# Patient Record
Sex: Female | Born: 1975 | Race: White | Hispanic: No | Marital: Married
Health system: Southern US, Community
[De-identification: ages and names within clinical notes are randomized; demographics above are authoritative.]

---

## 2011-04-15 ENCOUNTER — Other Ambulatory Visit (HOSPITAL_COMMUNITY)
Admission: RE | Admit: 2011-04-15 | Discharge: 2011-04-15 | Disposition: A | Discharge status: Home / Self Care | Payer: BC Managed Care – PPO | Source: Ambulatory Visit | Attending: Family Medicine | Admitting: Family Medicine

## 2011-04-15 DIAGNOSIS — Z124 Encounter for screening for malignant neoplasm of cervix: Secondary | ICD-10-CM | POA: Insufficient documentation

## 2012-04-14 ENCOUNTER — Other Ambulatory Visit (HOSPITAL_COMMUNITY)
Admission: RE | Admit: 2012-04-14 | Discharge: 2012-04-14 | Disposition: A | Discharge status: Home / Self Care | Payer: BC Managed Care – PPO | Source: Ambulatory Visit | Attending: Family Medicine | Admitting: Family Medicine

## 2012-04-14 DIAGNOSIS — Z124 Encounter for screening for malignant neoplasm of cervix: Secondary | ICD-10-CM | POA: Insufficient documentation

## 2013-06-08 ENCOUNTER — Other Ambulatory Visit (HOSPITAL_COMMUNITY)
Admission: RE | Admit: 2013-06-08 | Discharge: 2013-06-08 | Disposition: A | Discharge status: Home / Self Care | Payer: Managed Care, Other (non HMO) | Source: Ambulatory Visit | Attending: Family Medicine | Admitting: Family Medicine

## 2013-06-08 ENCOUNTER — Other Ambulatory Visit: Payer: Self-pay | Admitting: Physician Assistant

## 2013-06-08 DIAGNOSIS — Z124 Encounter for screening for malignant neoplasm of cervix: Secondary | ICD-10-CM | POA: Insufficient documentation

## 2015-09-06 ENCOUNTER — Other Ambulatory Visit: Payer: Self-pay | Admitting: Physician Assistant

## 2015-09-06 ENCOUNTER — Other Ambulatory Visit (HOSPITAL_COMMUNITY)
Admission: RE | Admit: 2015-09-06 | Discharge: 2015-09-06 | Disposition: A | Discharge status: Home / Self Care | Payer: BLUE CROSS/BLUE SHIELD | Source: Ambulatory Visit | Attending: Family Medicine | Admitting: Family Medicine

## 2015-09-06 DIAGNOSIS — Z124 Encounter for screening for malignant neoplasm of cervix: Secondary | ICD-10-CM | POA: Insufficient documentation

## 2015-09-10 LAB — CYTOLOGY - PAP

## 2016-01-08 ENCOUNTER — Other Ambulatory Visit: Payer: Self-pay

## 2016-01-08 DIAGNOSIS — Z1231 Encounter for screening mammogram for malignant neoplasm of breast: Secondary | ICD-10-CM

## 2016-01-09 ENCOUNTER — Ambulatory Visit
Admission: RE | Admit: 2016-01-09 | Discharge: 2016-01-09 | Disposition: A | Discharge status: Home / Self Care | Payer: BLUE CROSS/BLUE SHIELD | Source: Ambulatory Visit

## 2016-01-09 DIAGNOSIS — Z1231 Encounter for screening mammogram for malignant neoplasm of breast: Secondary | ICD-10-CM

## 2017-01-28 ENCOUNTER — Other Ambulatory Visit: Payer: Self-pay | Admitting: Physician Assistant

## 2017-01-28 DIAGNOSIS — Z1231 Encounter for screening mammogram for malignant neoplasm of breast: Secondary | ICD-10-CM

## 2017-02-13 ENCOUNTER — Ambulatory Visit: Payer: BLUE CROSS/BLUE SHIELD

## 2017-02-25 ENCOUNTER — Ambulatory Visit: Payer: BLUE CROSS/BLUE SHIELD

## 2017-03-26 ENCOUNTER — Ambulatory Visit
Admission: RE | Admit: 2017-03-26 | Discharge: 2017-03-26 | Disposition: A | Discharge status: Home / Self Care | Payer: BLUE CROSS/BLUE SHIELD | Source: Ambulatory Visit | Attending: Physician Assistant | Admitting: Physician Assistant

## 2017-03-26 DIAGNOSIS — Z1231 Encounter for screening mammogram for malignant neoplasm of breast: Secondary | ICD-10-CM

## 2018-07-21 DIAGNOSIS — H52223 Regular astigmatism, bilateral: Secondary | ICD-10-CM | POA: Diagnosis not present

## 2018-07-21 DIAGNOSIS — H5213 Myopia, bilateral: Secondary | ICD-10-CM | POA: Diagnosis not present

## 2018-07-21 DIAGNOSIS — H524 Presbyopia: Secondary | ICD-10-CM | POA: Diagnosis not present

## 2018-08-25 ENCOUNTER — Other Ambulatory Visit: Payer: Self-pay | Admitting: Physician Assistant

## 2018-08-25 DIAGNOSIS — Z1231 Encounter for screening mammogram for malignant neoplasm of breast: Secondary | ICD-10-CM

## 2018-10-14 ENCOUNTER — Other Ambulatory Visit: Payer: Self-pay | Admitting: Physician Assistant

## 2018-10-14 ENCOUNTER — Other Ambulatory Visit (HOSPITAL_COMMUNITY)
Admission: RE | Admit: 2018-10-14 | Discharge: 2018-10-14 | Disposition: A | Discharge status: Home / Self Care | Payer: 59 | Source: Ambulatory Visit | Attending: Family Medicine | Admitting: Family Medicine

## 2018-10-14 DIAGNOSIS — N6310 Unspecified lump in the right breast, unspecified quadrant: Secondary | ICD-10-CM

## 2018-10-14 DIAGNOSIS — Z01419 Encounter for gynecological examination (general) (routine) without abnormal findings: Secondary | ICD-10-CM | POA: Diagnosis not present

## 2018-10-14 DIAGNOSIS — Z0000 Encounter for general adult medical examination without abnormal findings: Secondary | ICD-10-CM | POA: Diagnosis not present

## 2018-10-14 DIAGNOSIS — Z13220 Encounter for screening for lipoid disorders: Secondary | ICD-10-CM | POA: Diagnosis not present

## 2018-10-14 DIAGNOSIS — Z124 Encounter for screening for malignant neoplasm of cervix: Secondary | ICD-10-CM | POA: Diagnosis not present

## 2018-10-15 LAB — CYTOLOGY - PAP: DIAGNOSIS: NEGATIVE

## 2018-10-18 ENCOUNTER — Other Ambulatory Visit: Payer: Self-pay | Admitting: Physician Assistant

## 2018-10-18 ENCOUNTER — Ambulatory Visit
Admission: RE | Admit: 2018-10-18 | Discharge: 2018-10-18 | Disposition: A | Discharge status: Home / Self Care | Payer: BLUE CROSS/BLUE SHIELD | Source: Ambulatory Visit | Attending: Physician Assistant | Admitting: Physician Assistant

## 2018-10-18 ENCOUNTER — Ambulatory Visit
Admission: RE | Admit: 2018-10-18 | Discharge: 2018-10-18 | Disposition: A | Discharge status: Home / Self Care | Payer: 59 | Source: Ambulatory Visit | Attending: Physician Assistant | Admitting: Physician Assistant

## 2018-10-18 DIAGNOSIS — N6310 Unspecified lump in the right breast, unspecified quadrant: Secondary | ICD-10-CM

## 2018-10-18 DIAGNOSIS — R922 Inconclusive mammogram: Secondary | ICD-10-CM | POA: Diagnosis not present

## 2018-10-18 DIAGNOSIS — N6314 Unspecified lump in the right breast, lower inner quadrant: Secondary | ICD-10-CM | POA: Diagnosis not present

## 2018-10-22 ENCOUNTER — Ambulatory Visit
Admission: RE | Admit: 2018-10-22 | Discharge: 2018-10-22 | Disposition: A | Discharge status: Home / Self Care | Payer: 59 | Source: Ambulatory Visit | Attending: Physician Assistant | Admitting: Physician Assistant

## 2018-10-22 ENCOUNTER — Other Ambulatory Visit: Payer: Self-pay | Admitting: Physician Assistant

## 2018-10-22 DIAGNOSIS — N6314 Unspecified lump in the right breast, lower inner quadrant: Secondary | ICD-10-CM | POA: Diagnosis not present

## 2018-10-22 DIAGNOSIS — N6310 Unspecified lump in the right breast, unspecified quadrant: Secondary | ICD-10-CM

## 2018-10-22 DIAGNOSIS — D241 Benign neoplasm of right breast: Secondary | ICD-10-CM | POA: Diagnosis not present

## 2019-03-24 MED FILL — ACYCLOVIR 400 MG TABLET: 400 | 90 days supply | Qty: 180 | Fill #0

## 2019-04-14 ENCOUNTER — Encounter (HOSPITAL_COMMUNITY): Payer: Self-pay | Admitting: Emergency Medicine

## 2019-04-14 ENCOUNTER — Emergency Department (HOSPITAL_COMMUNITY): Payer: 59

## 2019-04-14 ENCOUNTER — Other Ambulatory Visit: Payer: Self-pay

## 2019-04-14 ENCOUNTER — Emergency Department (HOSPITAL_COMMUNITY)
Admission: EM | Admit: 2019-04-14 | Discharge: 2019-04-14 | Disposition: A | Discharge status: Home / Self Care | Payer: 59 | Attending: Emergency Medicine | Admitting: Emergency Medicine

## 2019-04-14 DIAGNOSIS — Z79899 Other long term (current) drug therapy: Secondary | ICD-10-CM | POA: Insufficient documentation

## 2019-04-14 DIAGNOSIS — R079 Chest pain, unspecified: Secondary | ICD-10-CM | POA: Diagnosis not present

## 2019-04-14 DIAGNOSIS — J029 Acute pharyngitis, unspecified: Secondary | ICD-10-CM | POA: Diagnosis not present

## 2019-04-14 DIAGNOSIS — R0789 Other chest pain: Secondary | ICD-10-CM | POA: Insufficient documentation

## 2019-04-14 DIAGNOSIS — R0602 Shortness of breath: Secondary | ICD-10-CM | POA: Diagnosis not present

## 2019-04-14 DIAGNOSIS — Z20828 Contact with and (suspected) exposure to other viral communicable diseases: Secondary | ICD-10-CM | POA: Diagnosis not present

## 2019-04-14 LAB — CBC WITH DIFFERENTIAL/PLATELET
Abs Immature Granulocytes: 0.03 10*3/uL (ref 0.00–0.07)
Basophils Absolute: 0.1 10*3/uL (ref 0.0–0.1)
Basophils Relative: 1 %
Eosinophils Absolute: 0.3 10*3/uL (ref 0.0–0.5)
Eosinophils Relative: 4 %
HCT: 36.8 % (ref 36.0–46.0)
Hemoglobin: 11.9 g/dL — ABNORMAL LOW (ref 12.0–15.0)
Immature Granulocytes: 0 %
Lymphocytes Relative: 33 %
Lymphs Abs: 3.1 10*3/uL (ref 0.7–4.0)
MCH: 28.9 pg (ref 26.0–34.0)
MCHC: 32.3 g/dL (ref 30.0–36.0)
MCV: 89.3 fL (ref 80.0–100.0)
Monocytes Absolute: 1 10*3/uL (ref 0.1–1.0)
Monocytes Relative: 11 %
Neutro Abs: 4.9 10*3/uL (ref 1.7–7.7)
Neutrophils Relative %: 51 %
Platelets: 370 10*3/uL (ref 150–400)
RBC: 4.12 MIL/uL (ref 3.87–5.11)
RDW: 13.7 % (ref 11.5–15.5)
WBC: 9.4 10*3/uL (ref 4.0–10.5)
nRBC: 0 % (ref 0.0–0.2)

## 2019-04-14 LAB — BASIC METABOLIC PANEL
Anion gap: 14 (ref 5–15)
BUN: 8 mg/dL (ref 6–20)
CO2: 21 mmol/L — ABNORMAL LOW (ref 22–32)
Calcium: 8.9 mg/dL (ref 8.9–10.3)
Chloride: 103 mmol/L (ref 98–111)
Creatinine, Ser: 0.95 mg/dL (ref 0.44–1.00)
GFR calc Af Amer: >60 mL/min (ref >60)
GFR calc non Af Amer: >60 mL/min (ref >60)
Glucose, Bld: 143 mg/dL — ABNORMAL HIGH (ref 70–99)
Potassium: 3.4 mmol/L — ABNORMAL LOW (ref 3.5–5.1)
Sodium: 138 mmol/L (ref 135–145)

## 2019-04-14 LAB — I-STAT BETA HCG BLOOD, ED (MC, WL, AP ONLY): I-stat hCG, quantitative: <5 m[IU]/mL (ref <5)

## 2019-04-14 LAB — D-DIMER, QUANTITATIVE: D-Dimer, Quant: 0.33 ug/mL-FEU (ref 0.00–0.50)

## 2019-04-14 LAB — TROPONIN I: Troponin I: <0.03 ng/mL (ref <0.03)

## 2019-04-14 MED ORDER — NITROGLYCERIN 0.4 MG SL SUBL
0.4000 mg | SUBLINGUAL_TABLET | SUBLINGUAL | Status: DC | PRN
  Administered 2019-04-14: 0.4 mg via SUBLINGUAL
  Filled 2019-04-14: qty 1

## 2019-04-14 MED ORDER — POTASSIUM CHLORIDE CRYS ER 20 MEQ PO TBCR
20.0000 meq | EXTENDED_RELEASE_TABLET | Freq: Once | ORAL | Status: AC
  Administered 2019-04-14: 20 meq via ORAL
  Filled 2019-04-14: qty 1

## 2019-04-14 NOTE — ED Provider Notes (Signed)
[facility] EMERGENCY DEPARTMENT Provider Note   CSN: [id number] Arrival date & time: 04/14/19  0347    History   Chief Complaint Chief Complaint  Patient presents with   Chest Pain    HPI Shannon Russo is a 43 y.o. female with no significant past medical history who presents for evaluation of chest pain and SOB.  Patient reports that 3 days ago, she started noticing some heaviness/tightness across her anterior chest.  Patient states that it was very mild in nature.  No associated diaphoresis, nausea/vomiting. She states that the chest heaviness/tightness has been constant since Monday and has never gone away. Patient states that yesterday, she noticed that while walking up the stairs, she became very winded and had shortness of breath.  Patient states that her chest tightness/heaviness was slightly worsened by going up the stairs but wasn't worsened with all exertional activity. Patient states that the chest pain is not worse with deep inspiration.   Patient states that this morning at 3 AM, she woke up and felt like she was having trouble breathing.    Patient states she feels like "she is having trouble taking a deep breath." She states she laid on her back for a few minutes to see if the symptoms would resolve. Patient states she continued to feel like she was SOB prompting ED visit. Patient states that the chest tightness did not get worse.  Patient states she has not had any cough, congestion, fever.  She does report she had a sore throat about 5 days ago.  She works as an Therapist, sports at the [employer] and reports that she had been tested for COVID-19 but has not had results of the test. She denies any OCP use, recent immobilization, prior history of DVT/PE, recent surgery, leg swelling, or long travel.  Patient denies any abdominal pain, nausea/vomiting, numbness/weakness of arms or legs.  Patient states she is not a current smoker.  She denies any personal cardiac history and  denies any family history of MIs before age 23.  She denies any history of hypertension, diabetes.     The history is provided by the patient.    History reviewed. No pertinent past medical history.  There are no active problems to display for this patient.   History reviewed. No pertinent surgical history.   OB History   No obstetric history on file.      Home Medications    Prior to Admission medications   Medication Sig Start Date End Date Taking? Authorizing Provider  acyclovir (ZOVIRAX) 400 MG tablet Take 400 mg by mouth 2 (two) times a day. 03/24/19  Yes [provider]  Homeopathic Products (ZICAM COLD REMEDY PO) Take 1 tablet by mouth daily.   Yes [provider]  vitamin C (ASCORBIC ACID) 500 MG tablet Take 500 mg by mouth daily.   Yes [provider]    Family History Family History  Problem Relation Age of Onset   Breast cancer Maternal Aunt 70    Social History Social History   Tobacco Use   Smoking status: Not on file   Smokeless tobacco: Never Used  Substance Use Topics   Alcohol use: Never    Frequency: Never   Drug use: Never     Allergies   Patient has no known allergies.   Review of Systems Review of Systems  Constitutional: Negative for fever.  HENT: Positive for sore throat.   Respiratory: Positive for shortness of breath. Negative for  cough.   Cardiovascular: Positive for chest pain. Negative for leg swelling.  Gastrointestinal: Negative for abdominal pain, nausea and vomiting.  Genitourinary: Negative for dysuria and hematuria.  Neurological: Negative for headaches.  All other systems reviewed and are negative.    Physical Exam Updated Vital Signs BP 104/67    Pulse 63    Temp 98.4 F (36.9 C) (Oral)    Resp 14    Ht 5\' 5"  (1.651 m)    Wt 59.9 kg    LMP 04/07/2019 (Exact Date)    SpO2 100%    BMI 21.97 kg/m   Physical Exam Vitals signs and nursing note reviewed.  Constitutional:       Appearance: Normal appearance. She is well-developed.     Comments: Appears anxious   HENT:     Head: Normocephalic and atraumatic.  Eyes:     General: Lids are normal.     Conjunctiva/sclera: Conjunctivae normal.     Pupils: Pupils are equal, round, and reactive to light.  Neck:     Musculoskeletal: Full passive range of motion without pain.  Cardiovascular:     Rate and Rhythm: Normal rate and regular rhythm.     Pulses: Normal pulses.          Radial pulses are 2+ on the right side and 2+ on the left side.       Dorsalis pedis pulses are 2+ on the right side and 2+ on the left side.     Heart sounds: Normal heart sounds. No murmur. No friction rub. No gallop.   Pulmonary:     Effort: Pulmonary effort is normal.     Breath sounds: Normal breath sounds.     Comments: Lungs clear to auscultation bilaterally.  Symmetric chest rise.  No wheezing, rales, rhonchi.  Able to speak in full sentences without any difficulty. Abdominal:     Palpations: Abdomen is soft. Abdomen is not rigid.     Tenderness: There is no abdominal tenderness. There is no guarding.  Musculoskeletal: Normal range of motion.     Comments: Bilateral lower extremities are symmetric in appearance.  Skin:    General: Skin is warm and dry.     Capillary Refill: Capillary refill takes less than 2 seconds.  Neurological:     Mental Status: She is alert and oriented to person, place, and time.  Psychiatric:        Speech: Speech normal.      ED Treatments / Results  Labs (all labs ordered are listed, but only abnormal results are displayed) Labs Reviewed  BASIC METABOLIC PANEL - Abnormal; Notable for the following components:      Result Value   Potassium 3.4 (*)    CO2 21 (*)    Glucose, Bld 143 (*)    All other components within normal limits  CBC WITH DIFFERENTIAL/PLATELET - Abnormal; Notable for the following components:   Hemoglobin 11.9 (*)    All other components within normal limits  D-DIMER,  QUANTITATIVE (NOT AT [facility])  TROPONIN I  I-STAT BETA HCG BLOOD, ED ([facility], [facility], [facility] ONLY)    EKG EKG Interpretation  Date/Time:  Thursday Apr 14 2019 04:12:21 EDT Ventricular Rate:  78 PR Interval:    QRS Duration: 107 QT Interval:  429 QTC Calculation: 489 R Axis:   58 Text Interpretation:  Sinus rhythm Borderline prolonged QT interval No old tracing to compare Confirmed by Merrily Pew [phone]) on 04/14/2019 6:32:37 AM   Radiology Dg Chest Portable  1 View  Result Date: 04/14/2019 CLINICAL DATA:  Chest pain EXAM: PORTABLE CHEST 1 VIEW COMPARISON:  None. FINDINGS: The heart size and mediastinal contours are within normal limits. Both lungs are clear. The visualized skeletal structures are unremarkable. IMPRESSION: Negative chest. Electronically Signed   By: Monte Fantasia M.D.   On: 04/14/2019 04:45    Procedures Procedures (including critical care time)  Medications Ordered in ED Medications  nitroGLYCERIN (NITROSTAT) SL tablet 0.4 mg (0.4 mg Sublingual Given 04/14/19 0506)  potassium chloride SA (K-DUR) CR tablet 20 mEq (has no administration in time range)     Initial Impression / Assessment and Plan / ED Course  I have reviewed the triage vital signs and the nursing notes.  Pertinent labs & imaging results that were available during my care of the patient were reviewed by me and considered in my medical decision making (see chart for details).        43 year old female who presents for evaluation of chest pain shortness of breath.  Reports chest pain x3 days.  Reports noticing yesterday while walking up the stairs, she would become more short of breath.  Woke up at 3 AM this morning with worsening chest heaviness/tightness and shortness of breath.  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  Has not gotten the results back. Patient is afebrile, non-toxic appearing, sitting comfortably on examination table. Vital signs reviewed and stable.  Consider anxiety  versus musculoskeletal pain versus ACS etiology. History/physical exam is not concerning for aortic dissection, unstable angina.  Also consider COVID-19, though low suspicion.  Doubt infectious etiology due to lack of infectious symptoms.  Also consider PE given dyspnea with exertion.  Patient is low risk.  Plan for labs, chest x-ray, EKG.  Given patient's risk factors and presentation she has a HEART score of 1. She has no personal cardiac history and no family history of MI.   I-stat beta is negative. Troponin is negative. BMP shows potassium of 3.4, Bicarb of 21. Dimer negative. CBC shows no leukocytosis. Hgb is 11.9. CXR negative for any infectious etiology.   Discussed results with patient. Patient reports improvement in pain after being here in the ED.  I discussed at length with patient regarding her symptoms.  She states she came into the emergency department today mainly because the shortness of breath had worsened.  She does report that her chest pain has been constant since Monday with no episodes of pain-free time.  Given constant chest pain for 3 days, reassured by EKG and negative troponin.  I did discuss with patient regarding obtaining a second troponin given worsening of her symptoms at 3 AM.  Patient states that the chest pain had not worsened but only no shortness of breath.  Patient wishes to decline delta troponin and wants to go home.  Discussed risk versus benefits of declining delta troponin.  Patient expresses understanding.  She wishes not to proceed with delta troponin at this time.  Given low risk status and reassuring work-up in the ED after 3 days of constant chest pain, will plan to discharge home with outpatient follow-up. At this time, patient exhibits no emergent life-threatening condition that require further evaluation in ED or admission. .Strict return precautions discussed. Patient expresses understanding and agreement to plan.    Portions of this note were generated  with [employer]. Dictation errors may occur despite best attempts at proofreading.  Final Clinical Impressions(s) / ED Diagnoses   Final diagnoses:  Atypical chest pain  ED Discharge Orders    None       Desma Mcgregor 04/15/19 0012    Mesner, Corene Cornea, MD 04/15/19 [phone]

## 2019-04-14 NOTE — ED Notes (Signed)
Patient works at the [employer] , was tested for [employer] not back yet c/o sob

## 2019-04-14 NOTE — Discharge Instructions (Signed)
Follow up with your primary care doctor.   Return to the Emergency Department immediately if you experiencing worsening chest pain, difficulty breathing, nausea/vomiting, get very sweaty, headache or any other worsening or concerning symptoms.   

## 2019-04-14 NOTE — ED Triage Notes (Addendum)
Patient with chest pain that started on Monday.  Today she started to become winded doing everyday things, like going up stairs.  No nausea or vomiting.  She describes that pain as a pounding in her chest and a heaviness, burning sensation.  Tested for covid at [facility] drive up, going thru [employer], patient is a Gaffer at [employer].

## 2019-09-08 ENCOUNTER — Other Ambulatory Visit: Payer: Self-pay | Admitting: Physician Assistant

## 2019-09-08 DIAGNOSIS — Z1231 Encounter for screening mammogram for malignant neoplasm of breast: Secondary | ICD-10-CM

## 2019-10-20 DIAGNOSIS — Z0000 Encounter for general adult medical examination without abnormal findings: Secondary | ICD-10-CM | POA: Diagnosis not present

## 2019-10-20 DIAGNOSIS — Z13220 Encounter for screening for lipoid disorders: Secondary | ICD-10-CM | POA: Diagnosis not present

## 2019-10-20 DIAGNOSIS — Z131 Encounter for screening for diabetes mellitus: Secondary | ICD-10-CM | POA: Diagnosis not present

## 2019-10-26 ENCOUNTER — Other Ambulatory Visit: Payer: Self-pay

## 2019-10-26 ENCOUNTER — Ambulatory Visit
Admission: RE | Admit: 2019-10-26 | Discharge: 2019-10-26 | Disposition: A | Discharge status: Home / Self Care | Payer: 59 | Source: Ambulatory Visit | Attending: Physician Assistant | Admitting: Physician Assistant

## 2019-10-26 DIAGNOSIS — Z1231 Encounter for screening mammogram for malignant neoplasm of breast: Secondary | ICD-10-CM

## 2019-11-01 DIAGNOSIS — H5213 Myopia, bilateral: Secondary | ICD-10-CM | POA: Diagnosis not present

## 2019-11-01 DIAGNOSIS — H524 Presbyopia: Secondary | ICD-10-CM | POA: Diagnosis not present

## 2020-09-26 ENCOUNTER — Inpatient Hospital Stay: Payer: Self-pay | Attending: Physician Assistant

## 2020-09-26 ENCOUNTER — Other Ambulatory Visit: Payer: Self-pay

## 2020-09-26 DIAGNOSIS — Z23 Encounter for immunization: Secondary | ICD-10-CM | POA: Insufficient documentation

## 2020-09-26 NOTE — Progress Notes (Signed)
   Covid-19 Vaccination Clinic  Name:  Shannon Russo    MRN: [id number] DOB: January 30, 1976  09/26/2020  [name] was observed post Covid-19 immunization for 15 minutes without incident. She was provided with Vaccine Information Sheet and instruction to access the V-Safe system.   [name] was instructed to call 911 with any severe reactions post vaccine: Marland Kitchen Difficulty breathing  . Swelling of face and throat  . A fast heartbeat  . A bad rash all over body  . Dizziness and weakness

## 2020-10-08 ENCOUNTER — Other Ambulatory Visit: Payer: Self-pay | Admitting: Physician Assistant

## 2020-10-08 DIAGNOSIS — Z0000 Encounter for general adult medical examination without abnormal findings: Secondary | ICD-10-CM

## 2020-10-24 DIAGNOSIS — Z131 Encounter for screening for diabetes mellitus: Secondary | ICD-10-CM | POA: Diagnosis not present

## 2020-10-24 DIAGNOSIS — Z0000 Encounter for general adult medical examination without abnormal findings: Secondary | ICD-10-CM | POA: Diagnosis not present

## 2020-10-24 DIAGNOSIS — Z13220 Encounter for screening for lipoid disorders: Secondary | ICD-10-CM | POA: Diagnosis not present

## 2020-11-15 ENCOUNTER — Other Ambulatory Visit: Payer: Self-pay

## 2020-11-15 ENCOUNTER — Ambulatory Visit
Admission: RE | Admit: 2020-11-15 | Discharge: 2020-11-15 | Disposition: A | Discharge status: Home / Self Care | Payer: BC Managed Care – PPO | Source: Ambulatory Visit | Attending: Physician Assistant | Admitting: Physician Assistant

## 2020-11-15 DIAGNOSIS — Z1231 Encounter for screening mammogram for malignant neoplasm of breast: Secondary | ICD-10-CM | POA: Diagnosis not present

## 2020-11-15 DIAGNOSIS — Z0000 Encounter for general adult medical examination without abnormal findings: Secondary | ICD-10-CM

## 2020-11-20 DIAGNOSIS — D225 Melanocytic nevi of trunk: Secondary | ICD-10-CM | POA: Diagnosis not present

## 2020-11-20 DIAGNOSIS — L814 Other melanin hyperpigmentation: Secondary | ICD-10-CM | POA: Diagnosis not present

## 2020-11-20 DIAGNOSIS — L821 Other seborrheic keratosis: Secondary | ICD-10-CM | POA: Diagnosis not present

## 2020-11-20 DIAGNOSIS — D485 Neoplasm of uncertain behavior of skin: Secondary | ICD-10-CM | POA: Diagnosis not present

## 2021-11-04 DIAGNOSIS — Z124 Encounter for screening for malignant neoplasm of cervix: Secondary | ICD-10-CM | POA: Diagnosis not present

## 2021-11-04 DIAGNOSIS — Z131 Encounter for screening for diabetes mellitus: Secondary | ICD-10-CM | POA: Diagnosis not present

## 2021-11-04 DIAGNOSIS — Z0000 Encounter for general adult medical examination without abnormal findings: Secondary | ICD-10-CM | POA: Diagnosis not present

## 2021-11-04 DIAGNOSIS — Z13220 Encounter for screening for lipoid disorders: Secondary | ICD-10-CM | POA: Diagnosis not present

## 2021-11-22 ENCOUNTER — Other Ambulatory Visit: Payer: Self-pay | Admitting: Physician Assistant

## 2021-11-22 DIAGNOSIS — Z1231 Encounter for screening mammogram for malignant neoplasm of breast: Secondary | ICD-10-CM

## 2021-12-19 DIAGNOSIS — Z1231 Encounter for screening mammogram for malignant neoplasm of breast: Secondary | ICD-10-CM

## 2022-01-02 ENCOUNTER — Ambulatory Visit
Admission: RE | Admit: 2022-01-02 | Discharge: 2022-01-02 | Disposition: A | Discharge status: Home / Self Care | Payer: BC Managed Care – PPO | Source: Ambulatory Visit | Attending: Physician Assistant | Admitting: Physician Assistant

## 2022-01-02 DIAGNOSIS — Z1231 Encounter for screening mammogram for malignant neoplasm of breast: Secondary | ICD-10-CM | POA: Diagnosis not present

## 2022-07-02 DIAGNOSIS — Z1211 Encounter for screening for malignant neoplasm of colon: Secondary | ICD-10-CM | POA: Diagnosis not present

## 2022-07-02 DIAGNOSIS — K644 Residual hemorrhoidal skin tags: Secondary | ICD-10-CM | POA: Diagnosis not present

## 2022-07-02 DIAGNOSIS — K635 Polyp of colon: Secondary | ICD-10-CM | POA: Diagnosis not present

## 2022-07-02 DIAGNOSIS — Z8371 Family history of colonic polyps: Secondary | ICD-10-CM | POA: Diagnosis not present

## 2022-07-02 DIAGNOSIS — K648 Other hemorrhoids: Secondary | ICD-10-CM | POA: Diagnosis not present

## 2022-11-10 DIAGNOSIS — Z0000 Encounter for general adult medical examination without abnormal findings: Secondary | ICD-10-CM | POA: Diagnosis not present

## 2022-11-10 DIAGNOSIS — B009 Herpesviral infection, unspecified: Secondary | ICD-10-CM | POA: Diagnosis not present

## 2023-02-26 ENCOUNTER — Other Ambulatory Visit (HOSPITAL_BASED_OUTPATIENT_CLINIC_OR_DEPARTMENT_OTHER): Payer: Self-pay | Admitting: Physician Assistant

## 2023-02-26 DIAGNOSIS — Z1231 Encounter for screening mammogram for malignant neoplasm of breast: Secondary | ICD-10-CM

## 2023-03-02 ENCOUNTER — Ambulatory Visit (HOSPITAL_BASED_OUTPATIENT_CLINIC_OR_DEPARTMENT_OTHER)
Admission: RE | Admit: 2023-03-02 | Discharge: 2023-03-02 | Disposition: A | Discharge status: Home / Self Care | Payer: BC Managed Care – PPO | Source: Ambulatory Visit | Attending: Physician Assistant | Admitting: Physician Assistant

## 2023-03-02 DIAGNOSIS — Z1231 Encounter for screening mammogram for malignant neoplasm of breast: Secondary | ICD-10-CM | POA: Diagnosis not present

## 2023-09-10 IMAGING — MG MM DIGITAL SCREENING BILAT W/ TOMO AND CAD
6 of 10 series · 6 of 30 positions shown · non-contrast
Comparison: Previous exam(s).

CLINICAL DATA: Screening.

EXAM:
DIGITAL SCREENING BILATERAL MAMMOGRAM WITH TOMOSYNTHESIS AND CAD
TECHNIQUE: Bilateral screening digital craniocaudal and mediolateral oblique
mammograms were obtained. Bilateral screening digital breast
tomosynthesis was performed. The images were evaluated with
computer-aided detection.

[L MLO synth-2D]
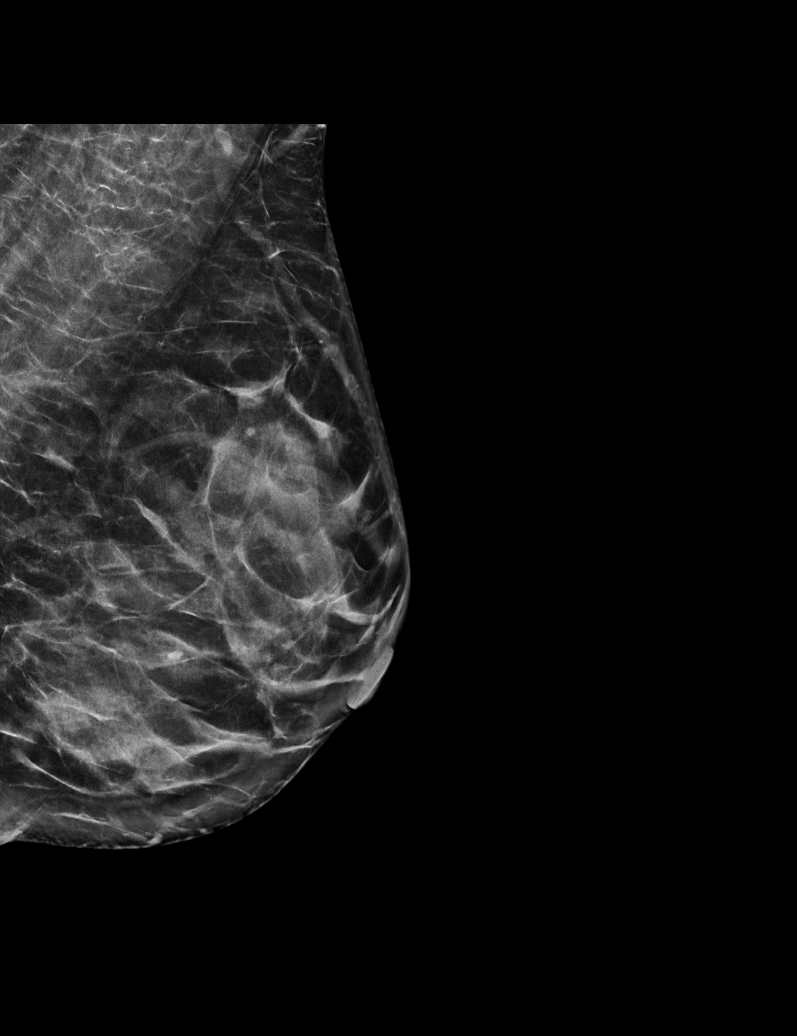

[R CC synth-2D]
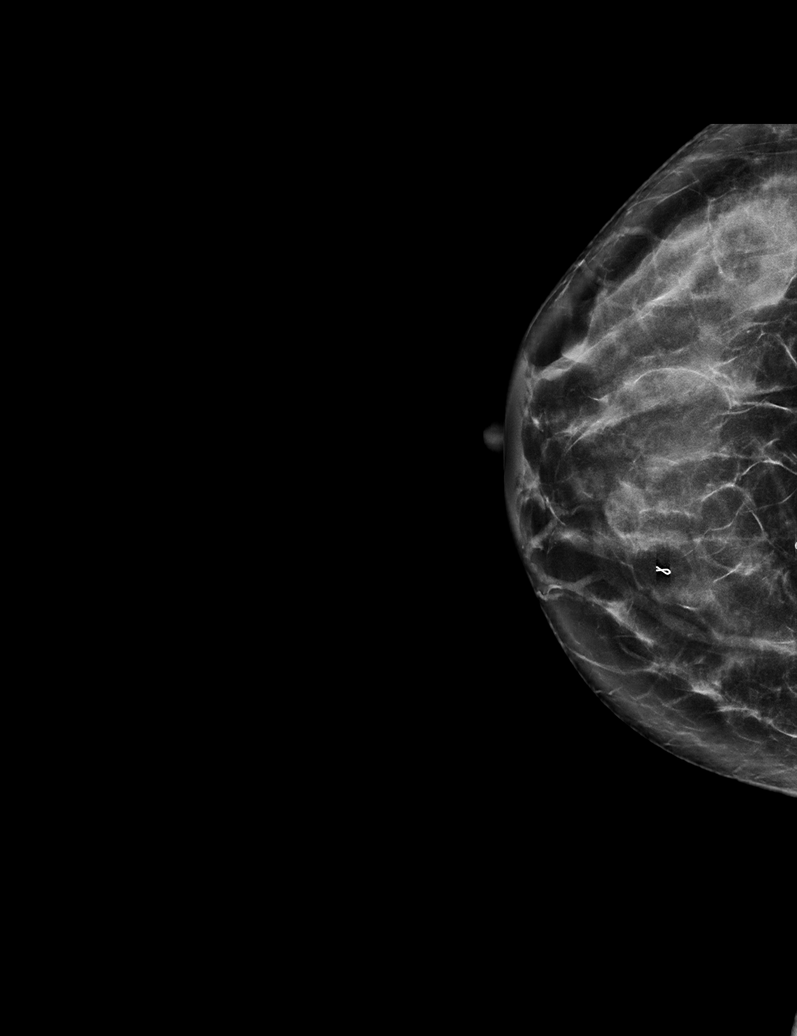

[L CC synth-2D]
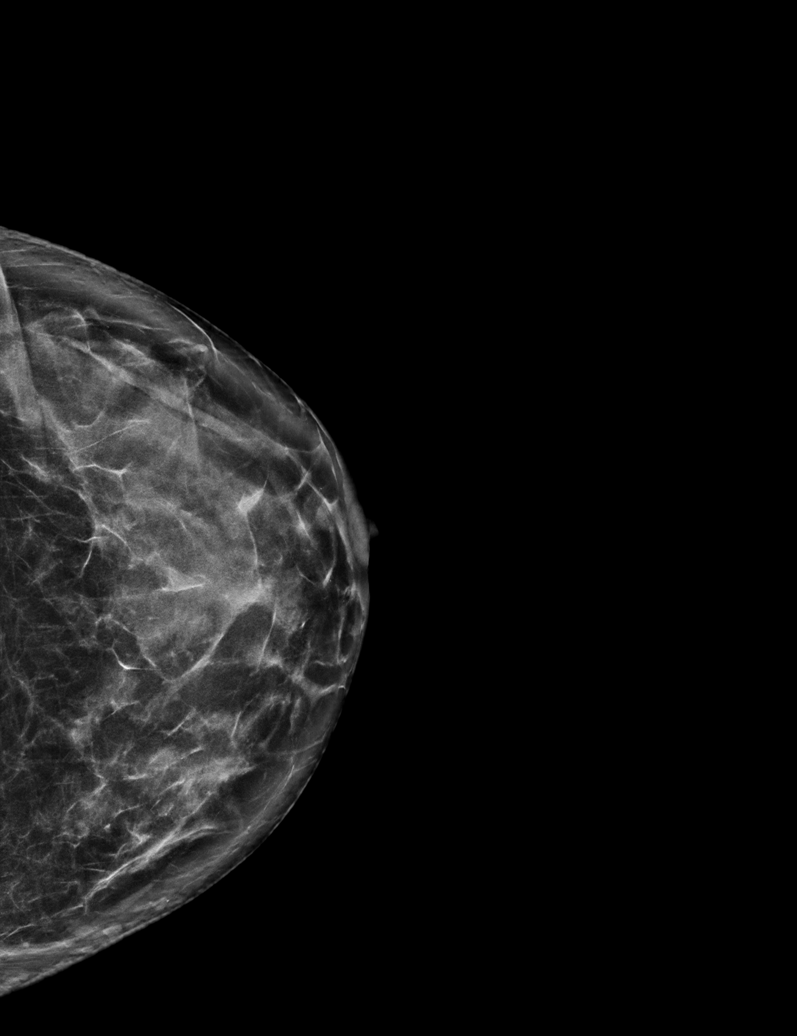

[R MLO synth-2D]
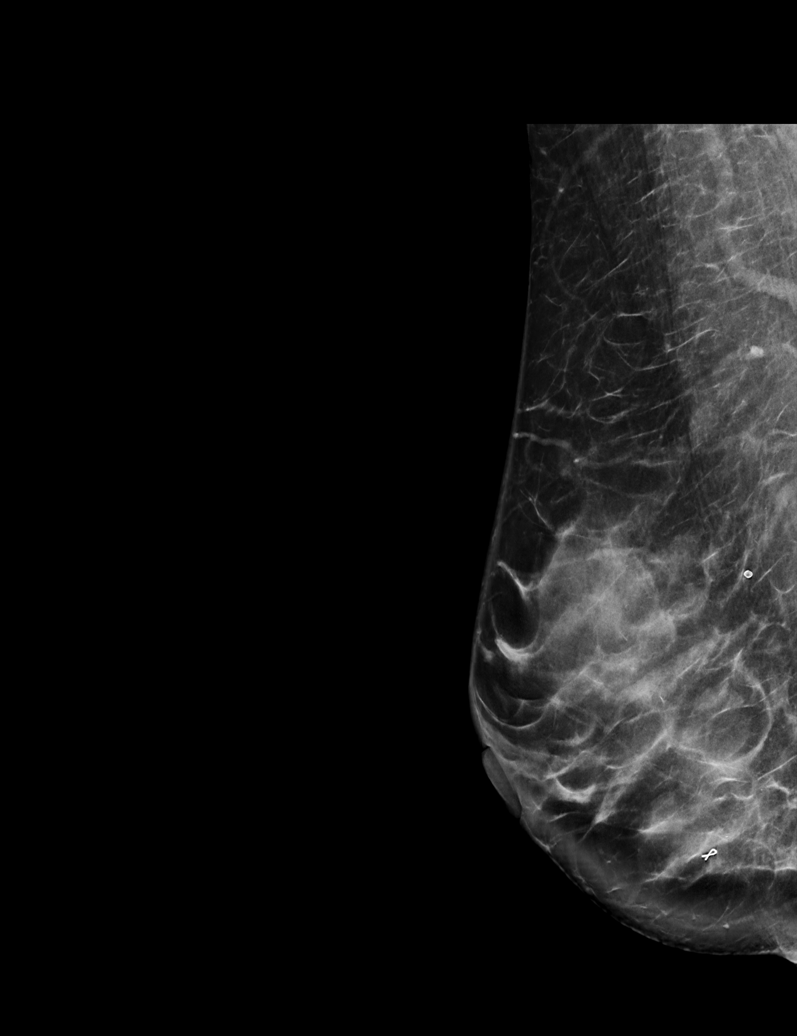

[L XCCL synth-2D]
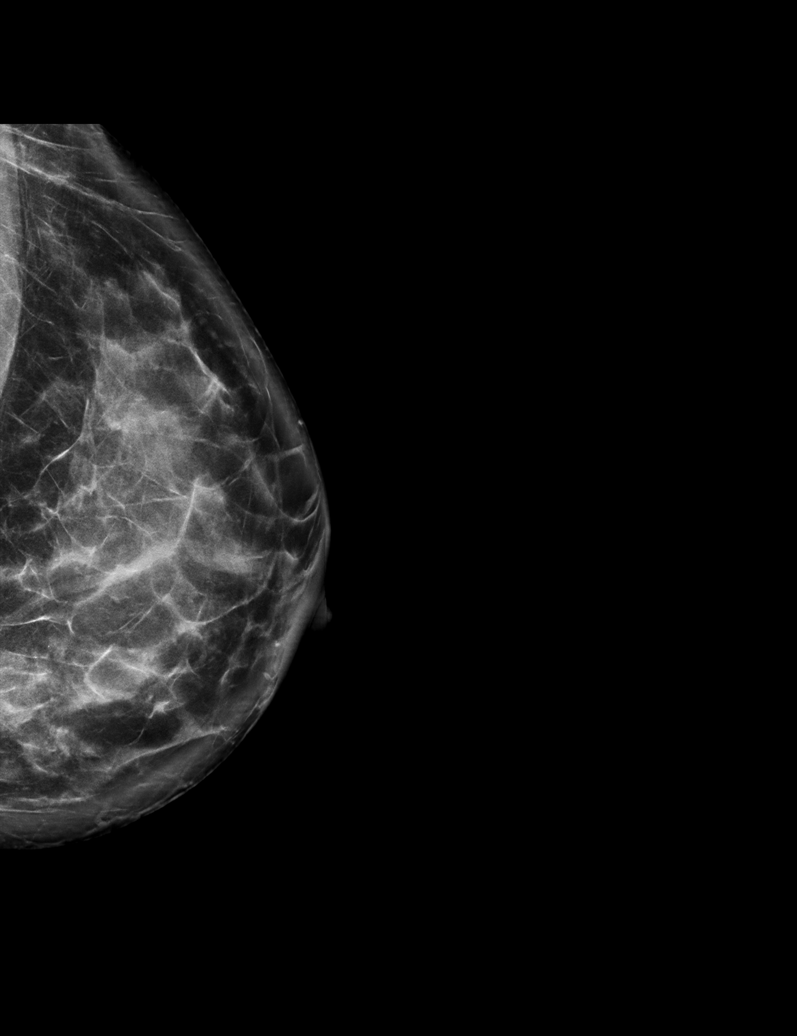

[R MLO tomo · tomo slice 39/77.0]
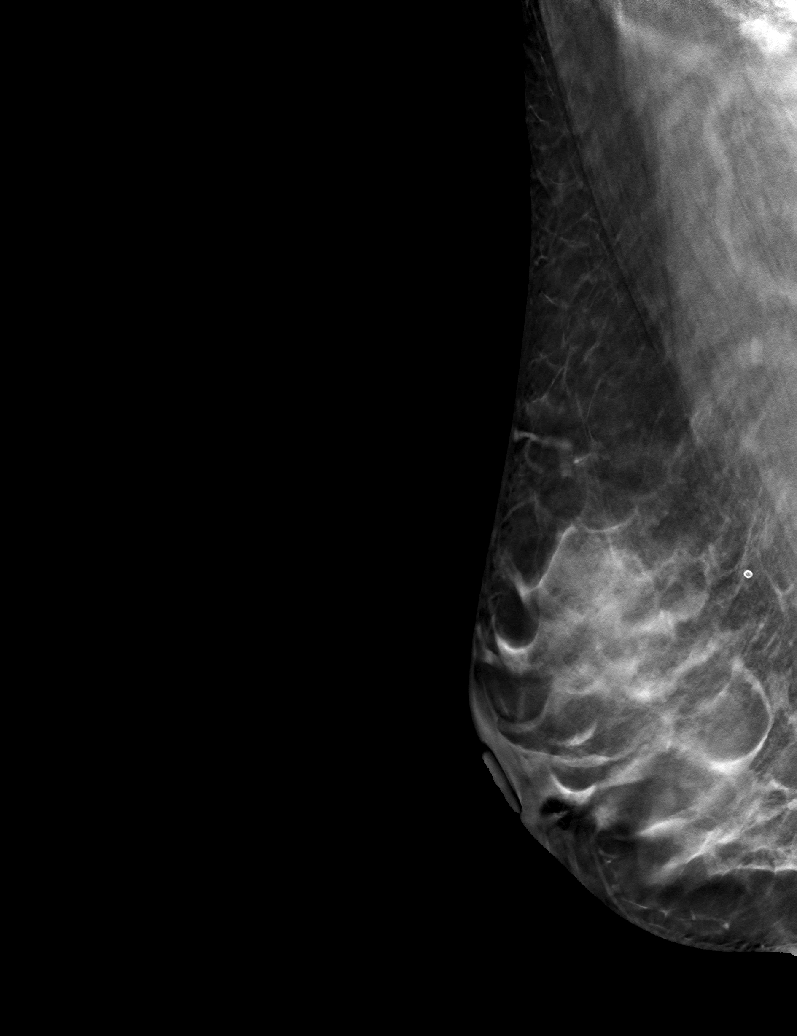

[6 of 30 positions shown; findings below may reference images not displayed]

ACR Breast Density Category c: The breast tissue is heterogeneously
dense, which may obscure small masses.
FINDINGS: There are no findings suspicious for malignancy.
IMPRESSION: No mammographic evidence of malignancy. A result letter of this
screening mammogram will be mailed directly to the patient.

RECOMMENDATION:
Screening mammogram in one year. (Code:Q3-W-BC3)

BI-RADS CATEGORY  1: Negative.

## 2023-11-13 DIAGNOSIS — Z0000 Encounter for general adult medical examination without abnormal findings: Secondary | ICD-10-CM | POA: Diagnosis not present

## 2024-07-29 ENCOUNTER — Encounter (HOSPITAL_BASED_OUTPATIENT_CLINIC_OR_DEPARTMENT_OTHER): Admitting: Radiology

## 2024-07-30 ENCOUNTER — Other Ambulatory Visit (HOSPITAL_BASED_OUTPATIENT_CLINIC_OR_DEPARTMENT_OTHER): Payer: Self-pay | Admitting: Physician Assistant

## 2024-07-30 DIAGNOSIS — Z1231 Encounter for screening mammogram for malignant neoplasm of breast: Secondary | ICD-10-CM

## 2024-08-05 ENCOUNTER — Encounter (HOSPITAL_BASED_OUTPATIENT_CLINIC_OR_DEPARTMENT_OTHER): Payer: Self-pay | Admitting: Radiology

## 2024-08-05 ENCOUNTER — Ambulatory Visit (HOSPITAL_BASED_OUTPATIENT_CLINIC_OR_DEPARTMENT_OTHER)
Admission: RE | Admit: 2024-08-05 | Discharge: 2024-08-05 | Disposition: A | Discharge status: Home / Self Care | Source: Ambulatory Visit | Attending: Physician Assistant | Admitting: Physician Assistant

## 2024-08-05 DIAGNOSIS — Z1231 Encounter for screening mammogram for malignant neoplasm of breast: Secondary | ICD-10-CM | POA: Insufficient documentation

## 2024-11-14 DIAGNOSIS — Z131 Encounter for screening for diabetes mellitus: Secondary | ICD-10-CM | POA: Diagnosis not present

## 2024-11-14 DIAGNOSIS — Z0000 Encounter for general adult medical examination without abnormal findings: Secondary | ICD-10-CM | POA: Diagnosis not present

## 2024-11-14 DIAGNOSIS — Z13220 Encounter for screening for lipoid disorders: Secondary | ICD-10-CM | POA: Diagnosis not present

## 2024-11-14 DIAGNOSIS — Z124 Encounter for screening for malignant neoplasm of cervix: Secondary | ICD-10-CM | POA: Diagnosis not present
